# Patient Record
Sex: Female | Born: 1995 | Race: White | Hispanic: No | Marital: Single | Smoking: Never smoker
Health system: Southern US, Community
[De-identification: ages and names within clinical notes are randomized; demographics above are authoritative.]

## PROBLEM LIST (undated history)

## (undated) DIAGNOSIS — K5090 Crohn's disease, unspecified, without complications: Secondary | ICD-10-CM

## (undated) DIAGNOSIS — J329 Chronic sinusitis, unspecified: Secondary | ICD-10-CM

## (undated) DIAGNOSIS — J45909 Unspecified asthma, uncomplicated: Secondary | ICD-10-CM

---

## 2013-03-29 ENCOUNTER — Emergency Department: Payer: Self-pay | Admitting: Emergency Medicine

## 2013-09-08 ENCOUNTER — Emergency Department: Payer: Self-pay | Admitting: Emergency Medicine

## 2013-09-08 LAB — COMPREHENSIVE METABOLIC PANEL
ALK PHOS: 91 U/L
ALT: 32 U/L (ref 12–78)
ANION GAP: 4 — AB (ref 7–16)
AST: 34 U/L — AB (ref 0–26)
Albumin: 3.7 g/dL — ABNORMAL LOW (ref 3.8–5.6)
BUN: 10 mg/dL (ref 7–18)
Bilirubin,Total: 0.5 mg/dL (ref 0.2–1.0)
CREATININE: 0.54 mg/dL — AB (ref 0.60–1.30)
Calcium, Total: 9.1 mg/dL (ref 9.0–10.7)
Chloride: 102 mmol/L (ref 98–107)
Co2: 29 mmol/L (ref 21–32)
EGFR (Non-African Amer.): >60
Glucose: 87 mg/dL (ref 65–99)
OSMOLALITY: 269 (ref 275–301)
Potassium: 3.8 mmol/L (ref 3.5–5.1)
Sodium: 135 mmol/L — ABNORMAL LOW (ref 136–145)
TOTAL PROTEIN: 8.3 g/dL (ref 6.4–8.6)

## 2013-09-08 LAB — CBC WITH DIFFERENTIAL/PLATELET
BASOS ABS: 0 10*3/uL (ref 0.0–0.1)
Basophil %: 0.6 %
Eosinophil #: 0.1 10*3/uL (ref 0.0–0.7)
Eosinophil %: 2.6 %
HCT: 34.9 % — AB (ref 35.0–47.0)
HGB: 11.1 g/dL — ABNORMAL LOW (ref 12.0–16.0)
Lymphocyte #: 0.7 10*3/uL — ABNORMAL LOW (ref 1.0–3.6)
Lymphocyte %: 12.2 %
MCH: 26.8 pg (ref 26.0–34.0)
MCHC: 31.8 g/dL — ABNORMAL LOW (ref 32.0–36.0)
MCV: 84 fL (ref 80–100)
MONO ABS: 0.5 x10 3/mm (ref 0.2–0.9)
Monocyte %: 9.7 %
NEUTROS PCT: 74.9 %
Neutrophil #: 4.1 10*3/uL (ref 1.4–6.5)
Platelet: 333 10*3/uL (ref 150–440)
RBC: 4.14 10*6/uL (ref 3.80–5.20)
RDW: 17.4 % — ABNORMAL HIGH (ref 11.5–14.5)
WBC: 5.5 10*3/uL (ref 3.6–11.0)

## 2013-09-08 LAB — LIPASE, BLOOD: Lipase: 108 U/L (ref 73–393)

## 2013-09-09 LAB — MONONUCLEOSIS SCREEN: Mono Test: POSITIVE

## 2016-03-29 ENCOUNTER — Encounter: Payer: Self-pay | Admitting: Emergency Medicine

## 2016-03-29 ENCOUNTER — Emergency Department
Admission: EM | Admit: 2016-03-29 | Discharge: 2016-03-29 | Disposition: A | Discharge status: Home / Self Care | Payer: BLUE CROSS/BLUE SHIELD | Attending: Emergency Medicine | Admitting: Emergency Medicine

## 2016-03-29 ENCOUNTER — Emergency Department: Payer: BLUE CROSS/BLUE SHIELD

## 2016-03-29 DIAGNOSIS — J45909 Unspecified asthma, uncomplicated: Secondary | ICD-10-CM | POA: Diagnosis not present

## 2016-03-29 DIAGNOSIS — B2790 Infectious mononucleosis, unspecified without complication: Secondary | ICD-10-CM | POA: Diagnosis not present

## 2016-03-29 DIAGNOSIS — J029 Acute pharyngitis, unspecified: Secondary | ICD-10-CM | POA: Diagnosis present

## 2016-03-29 HISTORY — DX: Crohn's disease, unspecified, without complications: K50.90

## 2016-03-29 HISTORY — DX: Unspecified asthma, uncomplicated: J45.909

## 2016-03-29 HISTORY — DX: Chronic sinusitis, unspecified: J32.9

## 2016-03-29 LAB — BASIC METABOLIC PANEL
Anion gap: 6 (ref 5–15)
BUN: <5 mg/dL — ABNORMAL LOW (ref 6–20)
CALCIUM: 9.6 mg/dL (ref 8.9–10.3)
CO2: 28 mmol/L (ref 22–32)
CREATININE: 0.48 mg/dL (ref 0.44–1.00)
Chloride: 104 mmol/L (ref 101–111)
GFR calc Af Amer: >60 mL/min (ref >60)
GLUCOSE: 91 mg/dL (ref 65–99)
Potassium: 4.3 mmol/L (ref 3.5–5.1)
Sodium: 138 mmol/L (ref 135–145)

## 2016-03-29 LAB — POCT PREGNANCY, URINE: PREG TEST UR: NEGATIVE

## 2016-03-29 LAB — MONONUCLEOSIS SCREEN: MONO SCREEN: POSITIVE — AB

## 2016-03-29 MED ORDER — DEXAMETHASONE SODIUM PHOSPHATE 10 MG/ML IJ SOLN
10.0000 mg | Freq: Once | INTRAMUSCULAR | Status: AC
  Administered 2016-03-29: 10 mg via INTRAVENOUS

## 2016-03-29 MED ORDER — PREDNISONE 10 MG PO TABS: ORAL_TABLET | ORAL | 0 refills | Status: AC

## 2016-03-29 MED ORDER — IOPAMIDOL (ISOVUE-300) INJECTION 61%
75.0000 mL | Freq: Once | INTRAVENOUS | Status: AC | PRN
  Administered 2016-03-29: 75 mL via INTRAVENOUS
  Filled 2016-03-29: qty 75

## 2016-03-29 NOTE — ED Provider Notes (Signed)
-----------------------------------------   5:54 PM on 03/29/2016 -----------------------------------------   Blood pressure 132/76, pulse 96, temperature 98.5 F (36.9 C), temperature source Oral, resp. rate 20, height 5\' 6"  (1.676 m), weight 71.2 kg, last menstrual period 03/07/2016, SpO2 99 %.  [facility]  In short, Whitney RheinLea Esther is a 20 y.o. female with a chief complaint of Sore Throat .  Refer to the original H&P for additional details.  The current plan of care is to give Decadron 10 mg IV while in the emergency room. Patient was made aware that her mono test was positive. We have already discussed discontinuing antibiotic therapy.  I assisted discussed the treatment plan with the patient's mother. Mother does not feel comfortable with her not finishing the antibiotics that were prescribed for her for strep throat. She states that the patient does have a strong history of strep throat as well as mono. Mother was reassured that patient is not dehydrated and that she has been encouraged to continue drinking fluids and also soft foods. Patient will [facility] if any continued problems. She is aware that she is not to play sports or physical activity. She was given a prescription for prednisone to begin on Friday 30 mg for 3 days. She will return to the emergency room for any severe worsening of her symptoms.   Tommi RumpsRhonda L Valleri Hendricksen, PA-C 03/29/16 1907    Nita Sicklearolina Veronese, MD 03/29/16 [phone]

## 2016-03-29 NOTE — ED Triage Notes (Signed)
Pt reports dx with strep 2 days ago and given z pack. Has frequent sinus infections and reports this doesn't normally work on her so they called in something different but feeling worse so did not go get. Odynophagia. Tonsils swollen with exudate. Mild swelling to uvula. No respiratory distress. Swallowing secretions in triage

## 2016-03-29 NOTE — ED Notes (Signed)
Pt dx with strep throat two days ago and was on zpack , states she is not any better and hurts really bad to swallow. No resp distress noted.

## 2016-03-29 NOTE — ED Notes (Signed)
Lab notified of add-on mono test.

## 2016-03-29 NOTE — Discharge Instructions (Addendum)
Begin prednisone tomorrow as directed for the next 3 days. Discontinue taking antibiotics. You may take Tylenol or ibuprofen as needed for throat discomfort. Continue drinking lots of fluids and soft foods such as ice cream or yogurt. Sports or physical exercise for 3-4 weeks. [facility] if any continued problems.

## 2016-04-03 ENCOUNTER — Other Ambulatory Visit: Payer: Self-pay | Admitting: Emergency Medicine

## 2016-04-03 NOTE — ED Provider Notes (Signed)
[facility] Emergency Department Provider Note   ____________________________________________   First MD Initiated Contact with Patient 03/29/16 1500     (approximate)  I have reviewed the triage vital signs and the nursing notes.   HISTORY  Chief Complaint Sore Throat    HPI Whitney Nunez is a 20 y.o. female who presents emergency room with complaints of increasingly sore throat despite antibiotic therapy. She reports she was seen 2 days prior and given a Z-Pak for a sinus infection as rash strep throat. Patient states that her tonsillar T Earlene Plater will with exudate noted. Denies any respiratory distress or swallowing difficulties.   Past Medical History:  Diagnosis Date  . Asthma   . Crohn disease (HCC)   . Sinus infection     There are no active problems to display for this patient.   History reviewed. No pertinent surgical history.  Prior to Admission medications   Medication Sig Start Date End Date Taking? Authorizing Provider  predniSONE (DELTASONE) 10 MG tablet Take 3 tablets once a day for 3 days beginning Friday 03/29/16   Tommi Rumps, PA-C    Allergies Sulfa antibiotics and Amoxicillin  History reviewed. No pertinent family history.  Social History Social History  Substance Use Topics  . Smoking status: Never Smoker  . Smokeless tobacco: Not on file  . Alcohol use Yes    Review of Systems Constitutional: No fever/chills ENT: Positive sore throat Cardiovascular: Denies chest pain. Respiratory: Denies shortness of breath. Musculoskeletal: Negative for back pain. Skin: Negative for rash. Neurological: Negative for headaches, focal weakness or numbness.  10-point ROS otherwise negative.  ____________________________________________   PHYSICAL EXAM:  VITAL SIGNS: ED Triage Vitals  Enc Vitals Group     BP 03/29/16 1326 123/78     Pulse Rate 03/29/16 1326 100     Resp 03/29/16 1326 18     Temp 03/29/16 1326 98.5  F (36.9 C)     Temp Source 03/29/16 1326 Oral     SpO2 03/29/16 1326 100 %     Weight 03/29/16 1327 157 lb (71.2 kg)     Height 03/29/16 1327 5\' 6"  (1.676 m)     Head Circumference --      Peak Flow --      Pain Score 03/29/16 1328 8     Pain Loc --      Pain Edu? --      Excl. in GC? --     Constitutional: Alert and oriented. Well appearing and in no acute distress. Eyes: Conjunctivae are normal. PERRL. EOMI. Head: Atraumatic. Nose: No congestion/rhinnorhea. Mouth/Throat: Mucous membranes are moist.  Oropharynx Is extremely erythematous with tonsillar exudate and swelling noted 2+ edema. Neck: No stridor.   Cardiovascular: Normal rate, regular rhythm. Grossly normal heart sounds.  Good peripheral circulation. Respiratory: Normal respiratory effort.  No retractions. Lungs CTAB. Gastrointestinal: Soft and nontender. No distention. No abdominal bruits. No CVA tenderness. Musculoskeletal: No lower extremity tenderness nor edema.  No joint effusions. Neurologic:  Normal speech and language. No gross focal neurologic deficits are appreciated. No gait instability. Skin:  Skin is warm, dry and intact. No rash noted. Psychiatric: Mood and affect are normal. Speech and behavior are normal.  ____________________________________________   LABS (all labs ordered are listed, but only abnormal results are displayed)  Labs Reviewed  BASIC METABOLIC PANEL - Abnormal; Notable for the following:       Result Value   BUN <5 (*)    All other  components within normal limits  MONONUCLEOSIS SCREEN - Abnormal; Notable for the following:    Mono Screen POSITIVE (*)    All other components within normal limits  POC URINE PREG, ED  POCT PREGNANCY, URINE   ____________________________________________  EKG   ____________________________________________  RADIOLOGY  IMPRESSION: 1. Mild generalized adenoid and tonsillar hypertrophy and enhancement with reactive appearing bilateral level 2 and  level 3 lymph nodes. Appearance is compatible with acute URI. No tonsillar abscess or other complicating features. 2. Maxillary sinus mucous retention cysts, significance doubtful. ____________________________________________   PROCEDURES  Procedure(s) performed: None  Procedures  Critical Care performed: No  ____________________________________________   INITIAL IMPRESSION / ASSESSMENT AND PLAN / ED COURSE  Pertinent labs & imaging results that were available during my care of the patient were reviewed by me and considered in my medical decision making (see chart for details).  Probable mononucleosis with acute URI. Patient transferred to Bridget Hartshornhonda Summers, PA at change of shift for definitive care.  Clinical Course     ____________________________________________   FINAL CLINICAL IMPRESSION(S) / ED DIAGNOSES  Final diagnoses:  Mononucleosis      NEW MEDICATIONS STARTED DURING THIS VISIT:  Discharge Medication List as of 03/29/2016  6:52 PM    START taking these medications   Details  predniSONE (DELTASONE) 10 MG tablet Take 3 tablets once a day for 3 days beginning Friday, Print         Note:  This document was prepared using Dragon voice recognition software and may include unintentional dictation errors.   Evangeline DakinCharles M Merilee Wible, PA-C 04/03/16 1313    Nita Sicklearolina Veronese, MD 04/03/16 1447

## 2016-07-14 ENCOUNTER — Emergency Department
Admission: EM | Admit: 2016-07-14 | Discharge: 2016-07-14 | Disposition: A | Discharge status: Home / Self Care | Payer: BLUE CROSS/BLUE SHIELD | Attending: Emergency Medicine | Admitting: Emergency Medicine

## 2016-07-14 DIAGNOSIS — J45909 Unspecified asthma, uncomplicated: Secondary | ICD-10-CM | POA: Insufficient documentation

## 2016-07-14 DIAGNOSIS — J111 Influenza due to unidentified influenza virus with other respiratory manifestations: Secondary | ICD-10-CM | POA: Insufficient documentation

## 2016-07-14 DIAGNOSIS — R509 Fever, unspecified: Secondary | ICD-10-CM | POA: Diagnosis present

## 2016-07-14 MED ORDER — OSELTAMIVIR PHOSPHATE 75 MG PO CAPS: 75.0000 mg | ORAL_CAPSULE | Freq: Two times a day (BID) | ORAL | 0 refills | Status: AC

## 2016-07-14 NOTE — Discharge Instructions (Signed)
May take OTC cold/flu medications as tolerated for supportive care.  Tylenol and Motrin as needed for fever and aches.  Drink fluids such as water and Gatorade to stay hydrated

## 2016-07-14 NOTE — ED Provider Notes (Signed)
[facility] Emergency Department Provider Note  ____________________________________________  Time seen: Approximately 4:01 PM  I have reviewed the triage vital signs and the nursing notes.   HISTORY  Chief Complaint URI    HPI Whitney Nunez is a 21 y.o. female , NAD, presents emergency department for evaluation of 2 day history of fevers, chills and body aches. Patient states she has been exposed to multiple family friends with influenza. States yesterday she began to have chills, fatigue and body aches. Also reports sore throat, cough and chest congestion. Has had no ear pain, sinus pressure, nasal congestion or drainage. Denies abdominal pain, nausea, vomiting or diarrhea.   Past Medical History:  Diagnosis Date  . Asthma   . Crohn disease (HCC)   . Sinus infection     There are no active problems to display for this patient.   History reviewed. No pertinent surgical history.  Prior to Admission medications   Medication Sig Start Date End Date Taking? Authorizing Provider  oseltamivir (TAMIFLU) 75 MG capsule Take 1 capsule (75 mg total) by mouth 2 (two) times daily. 07/14/16   Yusra Ravert L Kam Rahimi, PA-C  predniSONE (DELTASONE) 10 MG tablet Take 3 tablets once a day for 3 days beginning Friday 03/29/16   Tommi Rumps, PA-C    Allergies Sulfa antibiotics and Amoxicillin  No family history on file.  Social History Social History  Substance Use Topics  . Smoking status: Never Smoker  . Smokeless tobacco: Never Used  . Alcohol use Yes     Review of Systems  Constitutional: Positive fever, chills and fatigue. ENT: Positive sore throat. No sinus pressure, ear pressure, nasal congestion. Cardiovascular: No chest pain. Respiratory: Positive cough, chest congestion. No shortness of breath. No wheezing.  Gastrointestinal: No abdominal pain.  No nausea, vomiting.  No diarrhea.  No constipation. Genitourinary: Negative for dysuria. No hematuria. No  urinary hesitancy, urgency or increased frequency. Musculoskeletal: Positive for general myalgias.  Skin: Negative for rash. Neurological: Negative for headaches. 10-point ROS otherwise negative.  ____________________________________________   PHYSICAL EXAM:  VITAL SIGNS: ED Triage Vitals [07/14/16 1526]  Enc Vitals Group     BP (!) 112/58     Pulse Rate 62     Resp 18     Temp 99.1 F (37.3 C)     Temp Source Oral     SpO2 (!) 10 %     Weight 163 lb (73.9 kg)     Height 5\' 6"  (1.676 m)     Head Circumference      Peak Flow      Pain Score 5     Pain Loc      Pain Edu?      Excl. in GC?    Patient's oxygen saturation noted in triage as 10% is a mistake and should be 100%.  Constitutional: Alert and oriented. Well appearing and in no acute distress. Eyes: Conjunctivae are normal.  Head: Atraumatic. ENT:      Ears: TMs visualized bilaterally with mild serous effusion but no bulging, erythema or perforation.      Nose: Mild congestion with clear rhinorrhea.      Mouth/Throat: Mucous membranes are moist. Pharynx without erythema, swelling, a jeep. Uvula is midline. Clear postnasal drip. Neck: Supple with full range of motion. Hematological/Lymphatic/Immunilogical: No cervical lymphadenopathy. Cardiovascular: Normal rate, regular rhythm. Normal S1 and S2.  Good peripheral circulation. Respiratory: Normal respiratory effort without tachypnea or retractions. Lungs CTAB with breath sounds noted in all  lung fields. No wheeze, rhonchi, rales Neurologic:  Normal speech and language. No gross focal neurologic deficits are appreciated.  Skin:  Skin is warm, dry and intact. No rash noted. Psychiatric: Mood and affect are normal. Speech and behavior are normal. Patient exhibits appropriate insight and judgement.   ____________________________________________    LABS  None ____________________________________________  EKG  None ____________________________________________  RADIOLOGY  None ____________________________________________    PROCEDURES  Procedure(s) performed: None   Procedures   Medications - No data to display   ____________________________________________   INITIAL IMPRESSION / ASSESSMENT AND PLAN / ED COURSE  Pertinent labs & imaging results that were available during my care of the patient were reviewed by me and considered in my medical decision making (see chart for details).  Clinical Course     Patient's diagnosis is consistent with Influenza. Patient will be discharged home with prescriptions for Tamiflu to take extracted. Patient is advised to take over-the-counter cold and flu medications as needed for symptomatic care. May take Tylenol and ibuprofen dosing for fever or aches. [facility] if symptoms persist past this treatment course. Patient is given ED precautions to return to the ED for any worsening or new symptoms.    ____________________________________________  FINAL CLINICAL IMPRESSION(S) / ED DIAGNOSES  Final diagnoses:  Influenza      NEW MEDICATIONS STARTED DURING THIS VISIT:  Discharge Medication List as of 07/14/2016  4:12 PM    START taking these medications   Details  oseltamivir (TAMIFLU) 75 MG capsule Take 1 capsule (75 mg total) by mouth 2 (two) times daily., Starting Sat 07/14/2016, Print             Ernestene KielJami L CamancheHagler, PA-C 07/14/16 1621    Myrna Blazeravid Matthew Schaevitz, MD 07/14/16 [phone]

## 2016-07-14 NOTE — ED Triage Notes (Signed)
Pt arrives to ER via POV c/o URI sx. States that everyone she has been around has been dx with flu. Pt wearing mask at time of triage. Denies vomiting or diarrhea. Pt alert and oriented X4, active, cooperative, pt in NAD. RR even and unlabored, color WNL.

## 2016-07-19 ENCOUNTER — Encounter: Payer: Self-pay | Admitting: Emergency Medicine

## 2016-07-19 ENCOUNTER — Emergency Department: Payer: BLUE CROSS/BLUE SHIELD

## 2016-07-19 ENCOUNTER — Emergency Department
Admission: EM | Admit: 2016-07-19 | Discharge: 2016-07-19 | Disposition: A | Discharge status: Home / Self Care | Payer: BLUE CROSS/BLUE SHIELD | Attending: Emergency Medicine | Admitting: Emergency Medicine

## 2016-07-19 DIAGNOSIS — J45909 Unspecified asthma, uncomplicated: Secondary | ICD-10-CM | POA: Diagnosis not present

## 2016-07-19 DIAGNOSIS — R0781 Pleurodynia: Secondary | ICD-10-CM | POA: Diagnosis present

## 2016-07-19 DIAGNOSIS — R0789 Other chest pain: Secondary | ICD-10-CM | POA: Diagnosis not present

## 2016-07-19 LAB — CBC
HEMATOCRIT: 35.7 % (ref 35.0–47.0)
HEMOGLOBIN: 11.7 g/dL — AB (ref 12.0–16.0)
MCH: 28.8 pg (ref 26.0–34.0)
MCHC: 32.9 g/dL (ref 32.0–36.0)
MCV: 87.7 fL (ref 80.0–100.0)
Platelets: 324 10*3/uL (ref 150–440)
RBC: 4.06 MIL/uL (ref 3.80–5.20)
RDW: 19.2 % — ABNORMAL HIGH (ref 11.5–14.5)
WBC: 4.6 10*3/uL (ref 3.6–11.0)

## 2016-07-19 LAB — BASIC METABOLIC PANEL
ANION GAP: 6 (ref 5–15)
BUN: 10 mg/dL (ref 6–20)
CO2: 28 mmol/L (ref 22–32)
Calcium: 9.8 mg/dL (ref 8.9–10.3)
Chloride: 106 mmol/L (ref 101–111)
Creatinine, Ser: 0.44 mg/dL (ref 0.44–1.00)
GFR calc non Af Amer: >60 mL/min (ref >60)
Glucose, Bld: 74 mg/dL (ref 65–99)
POTASSIUM: 4.2 mmol/L (ref 3.5–5.1)
SODIUM: 140 mmol/L (ref 135–145)

## 2016-07-19 LAB — TROPONIN I

## 2016-07-19 MED ORDER — NAPROXEN 500 MG PO TABS: 500.0000 mg | ORAL_TABLET | Freq: Two times a day (BID) | ORAL | 2 refills | Status: DC

## 2016-07-19 NOTE — ED Provider Notes (Signed)
[facility] Emergency Department Provider Note   ____________________________________________    I have reviewed the triage vital signs and the nursing notes.   HISTORY  Chief Complaint Chest Pain (Lower L Rib Pain)     HPI Hagan Maltz is a 21 y.o. female who presents with complaints of left lower rib pain for 2 days. Patient denies injury to the area. She denies shortness of breath. No recent travel. No calf pain or swelling. She has never had this before. No rash or redness. She reports she has pain when she twists her torso or takes a deep breath or pushes on the area   Past Medical History:  Diagnosis Date  . Asthma   . Crohn disease (HCC)   . Sinus infection     There are no active problems to display for this patient.   History reviewed. No pertinent surgical history.  Prior to Admission medications   Medication Sig Start Date End Date Taking? Authorizing Provider  oseltamivir (TAMIFLU) 75 MG capsule Take 1 capsule (75 mg total) by mouth 2 (two) times daily. 07/14/16   Jami L Hagler, PA-C  predniSONE (DELTASONE) 10 MG tablet Take 3 tablets once a day for 3 days beginning Friday 03/29/16   Tommi Rumps, PA-C     Allergies Sulfa antibiotics and Amoxicillin  No family history on file.  Social History Social History  Substance Use Topics  . Smoking status: Never Smoker  . Smokeless tobacco: Never Used  . Alcohol use Yes    Review of Systems  Constitutional: No fever/chills     Gastrointestinal: No abdominal pain.  No nausea, no vomiting.    Musculoskeletal: Negative for back pain. Skin: Negative for rash. Neurological: Negative for headaches     ____________________________________________   PHYSICAL EXAM:  VITAL SIGNS: ED Triage Vitals  Enc Vitals Group     BP 07/19/16 1645 (!) 143/91     Pulse Rate 07/19/16 1645 75     Resp 07/19/16 1645 18     Temp 07/19/16 1645 97.4 F (36.3 C)     Temp Source  07/19/16 1645 Oral     SpO2 07/19/16 1645 100 %     Weight 07/19/16 1646 160 lb (72.6 kg)     Height 07/19/16 1646 5\' 6"  (1.676 m)     Head Circumference --      Peak Flow --      Pain Score 07/19/16 1646 8     Pain Loc --      Pain Edu? --      Excl. in GC? --     Constitutional: Alert and oriented. No acute distress. Pleasant and interactive  Nose: No congestion/rhinnorhea. Mouth/Throat: Mucous membranes are moist.   Cardiovascular: Normal rate, regular rhythm. Tenderness to palpation along the left 12th medial rib Respiratory: Normal respiratory effort.  No retractions. Genitourinary: deferred Musculoskeletal: No lower extremity tenderness nor edema.   Neurologic:  Normal speech and language. No gross focal neurologic deficits are appreciated.   Skin:  Skin is warm, dry and intact. No rash noted.   ____________________________________________   LABS (all labs ordered are listed, but only abnormal results are displayed)  Labs Reviewed  CBC - Abnormal; Notable for the following:       Result Value   Hemoglobin 11.7 (*)    RDW 19.2 (*)    All other components within normal limits  BASIC METABOLIC PANEL  TROPONIN I   ____________________________________________  EKG  ED  ECG REPORT I, Jene EveryKINNER, Melayah Skorupski, the attending physician, personally viewed and interpreted this ECG.  Date: 07/19/2016  Rhythm: normal sinus rhythm QRS Axis: normal Intervals: normal ST/T Wave abnormalities: normal Conduction Disturbances: none Narrative Interpretation: unremarkable  ____________________________________________  RADIOLOGY  Normal chest x-ray ____________________________________________   PROCEDURES  Procedure(s) performed: No    Critical Care performed: No ____________________________________________   INITIAL IMPRESSION / ASSESSMENT AND PLAN / ED COURSE  Pertinent labs & imaging results that were available during my care of the patient were reviewed by me and  considered in my medical decision making (see chart for details).  Patient well-appearing and in no acute distress. EKG and labs are reassuring. Chest x-ray is benign. Patient is tender to palpation along the 12th rib on the left, strongly suspect costochondritis or other musculoskeletal pain. Recommend supportive treatment with NSAIDs and ice and PCP follow-up. Not consistent with PE given external tenderness, lack of shortness of breath, lack of tachycardia and normal EKG.   ____________________________________________   FINAL CLINICAL IMPRESSION(S) / ED DIAGNOSES  Final diagnoses:  Chest wall pain      NEW MEDICATIONS STARTED DURING THIS VISIT:  Discharge Medication List as of 07/19/2016  6:50 PM       Note:  This document was prepared using Dragon voice recognition software and may include unintentional dictation errors.    Jene Everyobert Jennesis Ramaswamy, MD 07/19/16 2138

## 2016-07-19 NOTE — ED Triage Notes (Signed)
Pt presents to ED with c/o L lower rib pain. Pt states was recently dx with flu and has been taking tamiflu. Pt denies any known injury to her L rib, pt states hurts all the way around. Pt is alert and oriented at this time. Pt denies any known injury at this time.

## 2016-07-19 NOTE — ED Notes (Signed)
Pt c/o RT rib cage area pain after painful BM yesterday. 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 Pt states painful during with breathing and cough. Pt ambulatory, NAD noted

## 2017-06-24 IMAGING — CT CT NECK W/ CM
3 of 5 series · 12 of 33 positions shown, 14 images · IV contrast (iopamidol)
Comparison: None.

CLINICAL DATA: 20-year-old female with sore throat for 2 days on
antibiotics but symptoms progressing. Initial encounter.

EXAM:
CT NECK WITH CONTRAST
TECHNIQUE: Multidetector CT imaging of the neck was performed using the
standard protocol following the bolus administration of intravenous
contrast.
CONTRAST:  75mL [id number] IOPAMIDOL ([id number]) INJECTION 61%

[Series 6: sag neck · sagittal · 0.45mm/px · 5 of 123 slices shown, 6 images]
[im 41/123  bone]
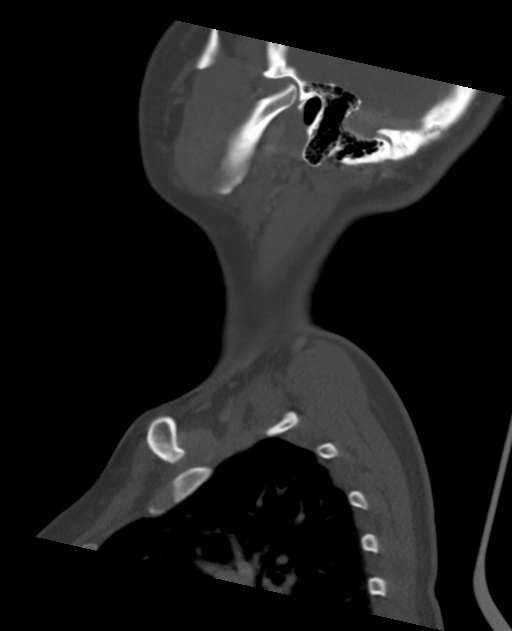
[im 51/123  bone]
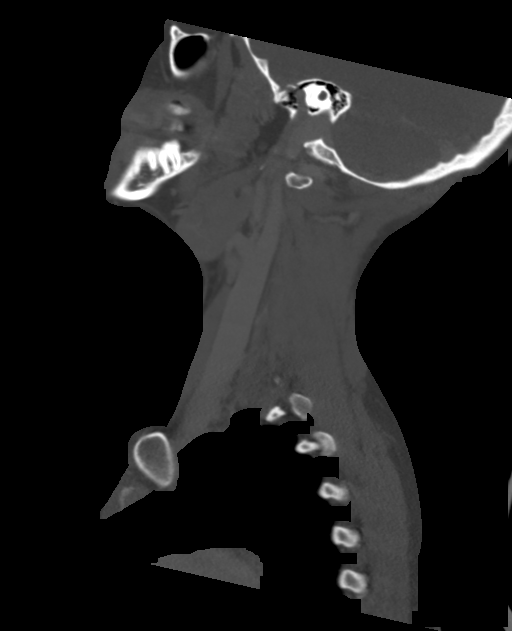
[im 62/123  soft-tissue]
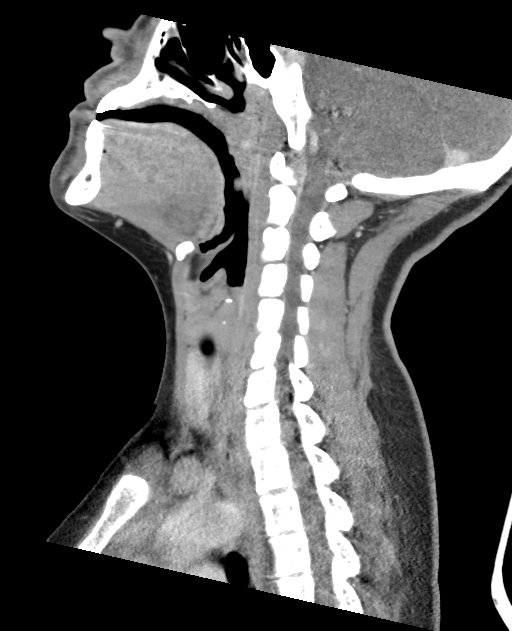
[im 62/123  bone]
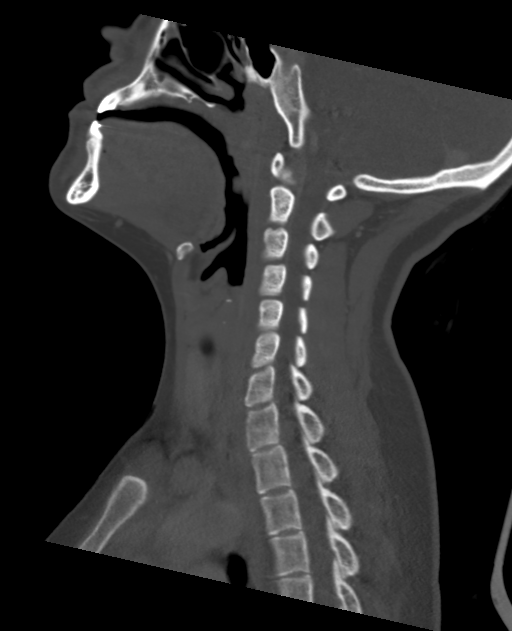
[im 72/123  bone]
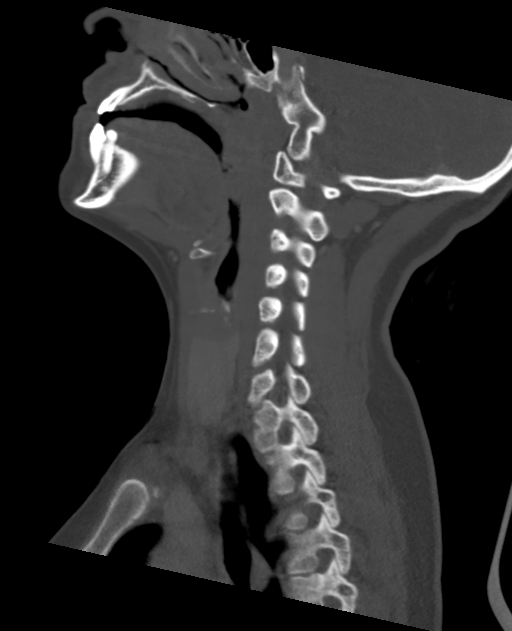
[im 82/123  bone]
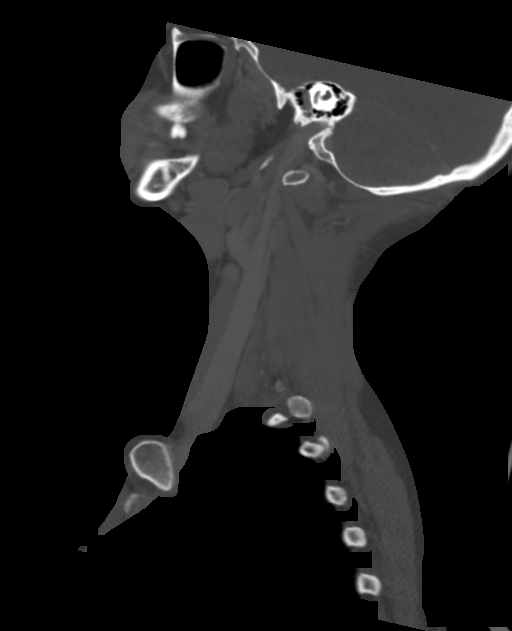

[Series 7: cor neck · coronal · 0.39mm/px · 3 of 99 slices shown]
[im 20/99  bone]
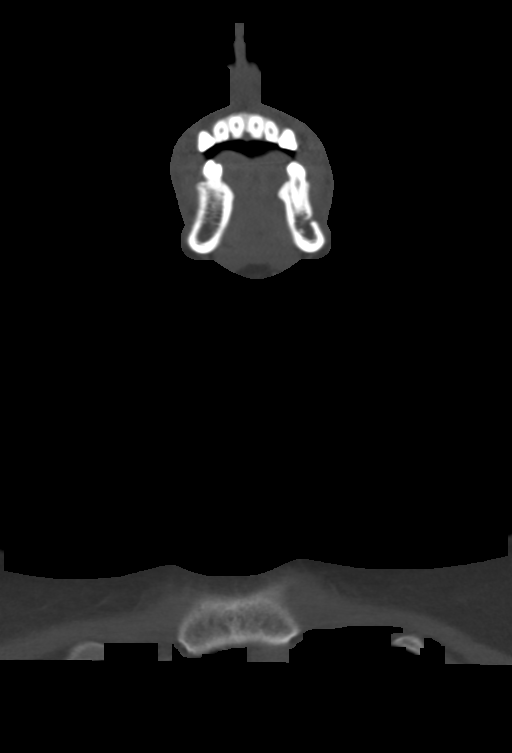
[im 40/99  bone]
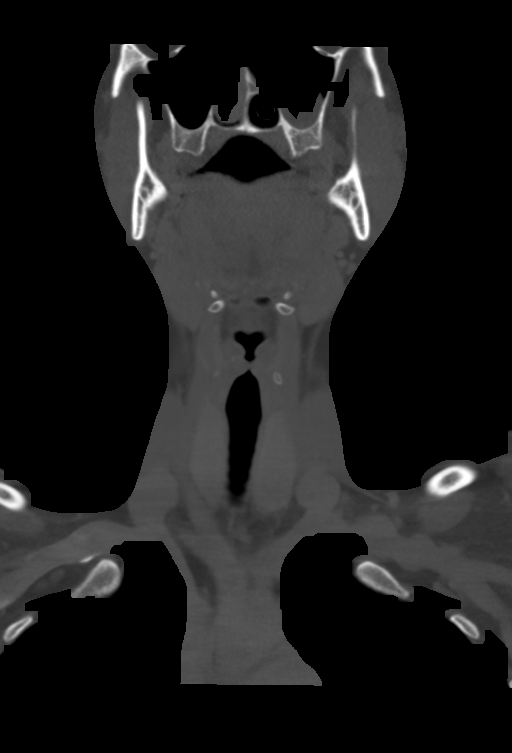
[im 59/99  bone]
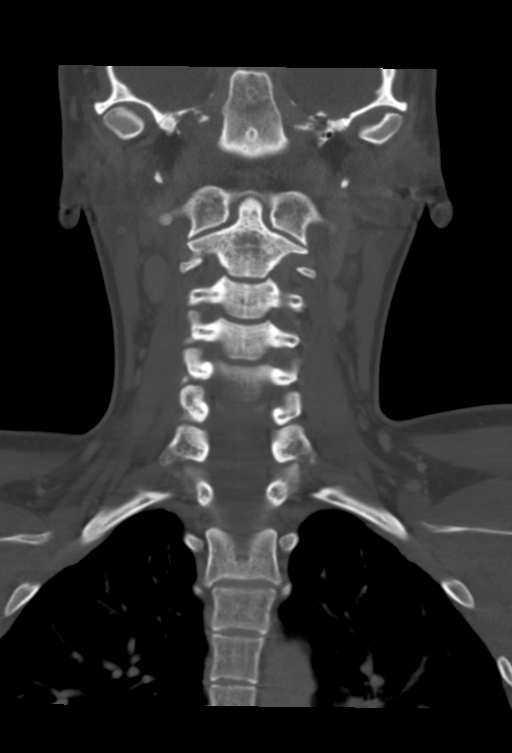

[Series 8: orthogonal ax · axial · 0.39mm/px · z∈[-187,-14]mm · 4 of 149 slices shown, 5 images]
[im 30/149  soft-tissue]
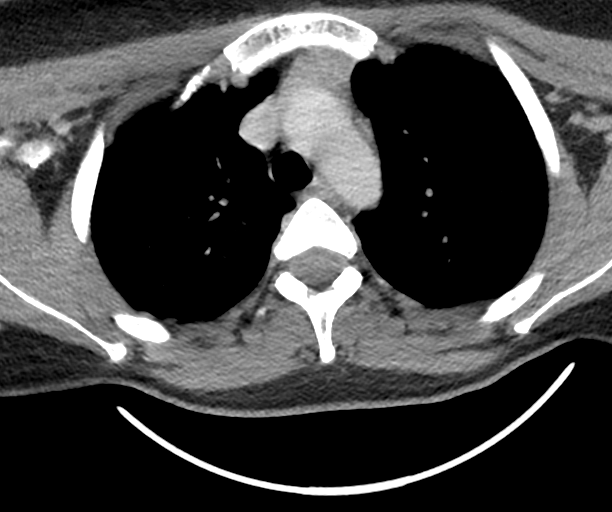
[im 30/149  bone]
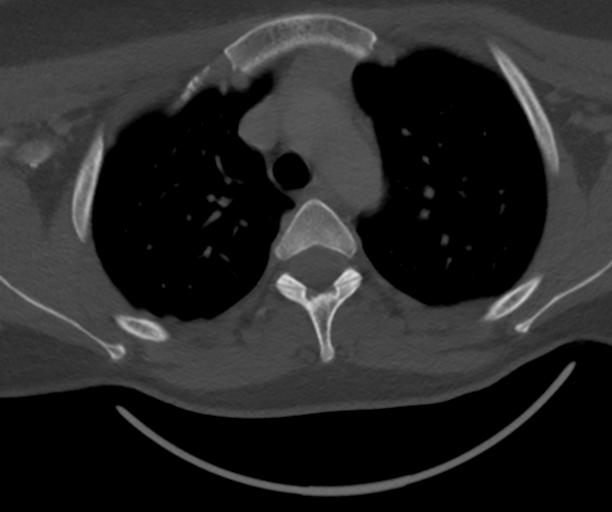
[im 60/149  bone]
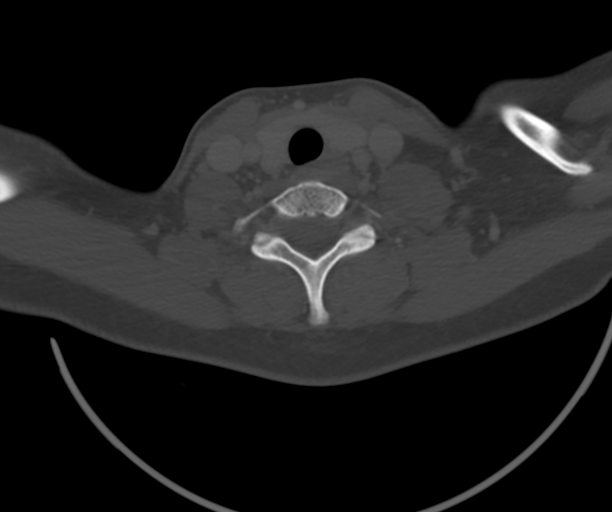
[im 89/149  bone]
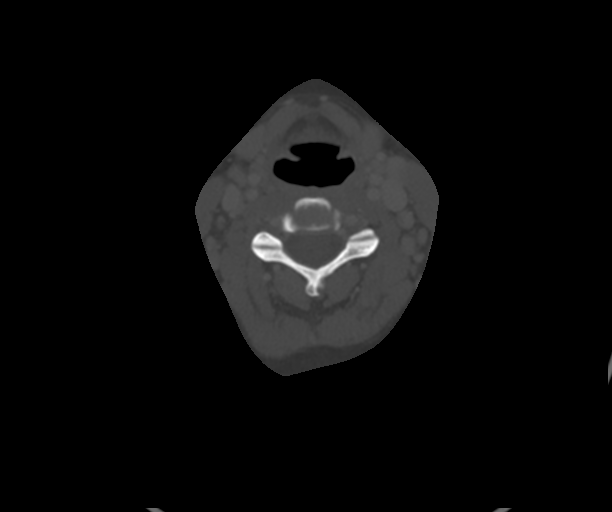
[im 119/149  bone]
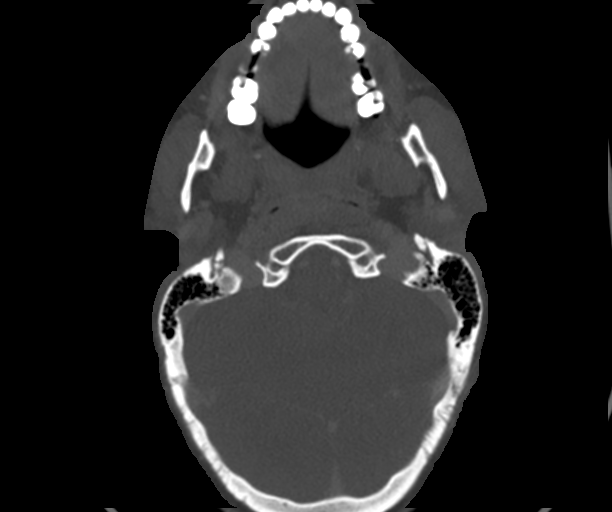

[12 of 33 positions shown; findings below may reference images not displayed]

FINDINGS: Pharynx and larynx: The larynx and epiglottis are within normal
limits. There is mild palatine tonsil, lingual tonsil, and adenoid
hypertrophy. Mild associated tonsillar hyper enhancement. No
parapharyngeal space inflammation. Negative retropharyngeal space.

Salivary glands: Negative sublingual space. Negative submandibular
and parotid glands.

Thyroid: Negative.

Lymph nodes: Bilateral cervical lymph nodes are mildly rounded and
enhancing but remain normal size for age. No cystic or necrotic
nodes. There is an increased number of small bilateral level 2 and
level 3 nodes.

Vascular: Major vascular structures in the neck and at the skullbase
are patent including both internal jugular veins.

Limited intracranial: Negative.

Visualized orbits: Not included.

Mastoids and visualized paranasal sinuses: Left maxillary mucous
retention cyst. Small right maxillary mucous retention cysts. Other
visible paranasal sinuses, the tympanic cavities, and mastoids are
clear.

Skeleton: Dentition appears normal. No osseous abnormality
identified.

Upper chest: Negative lung apices. Small volume residual thymus in
the superior mediastinum which otherwise is normal. No axillary
lymphadenopathy.

Other: None.
IMPRESSION: 1. Mild generalized adenoid and tonsillar hypertrophy and
enhancement with reactive appearing bilateral level 2 and level 3
lymph nodes.
Appearance is compatible with acute URI. No tonsillar abscess or
other complicating features.
2. Maxillary sinus mucous retention cysts, significance doubtful.

## 2017-10-14 IMAGING — CR DG CHEST 2V
2 series · 2 of 2 positions shown · non-contrast
Comparison: None.

CLINICAL DATA: Left lower anterior rib pain for the past 2 days.

EXAM:
CHEST  2 VIEW

[chest pa]
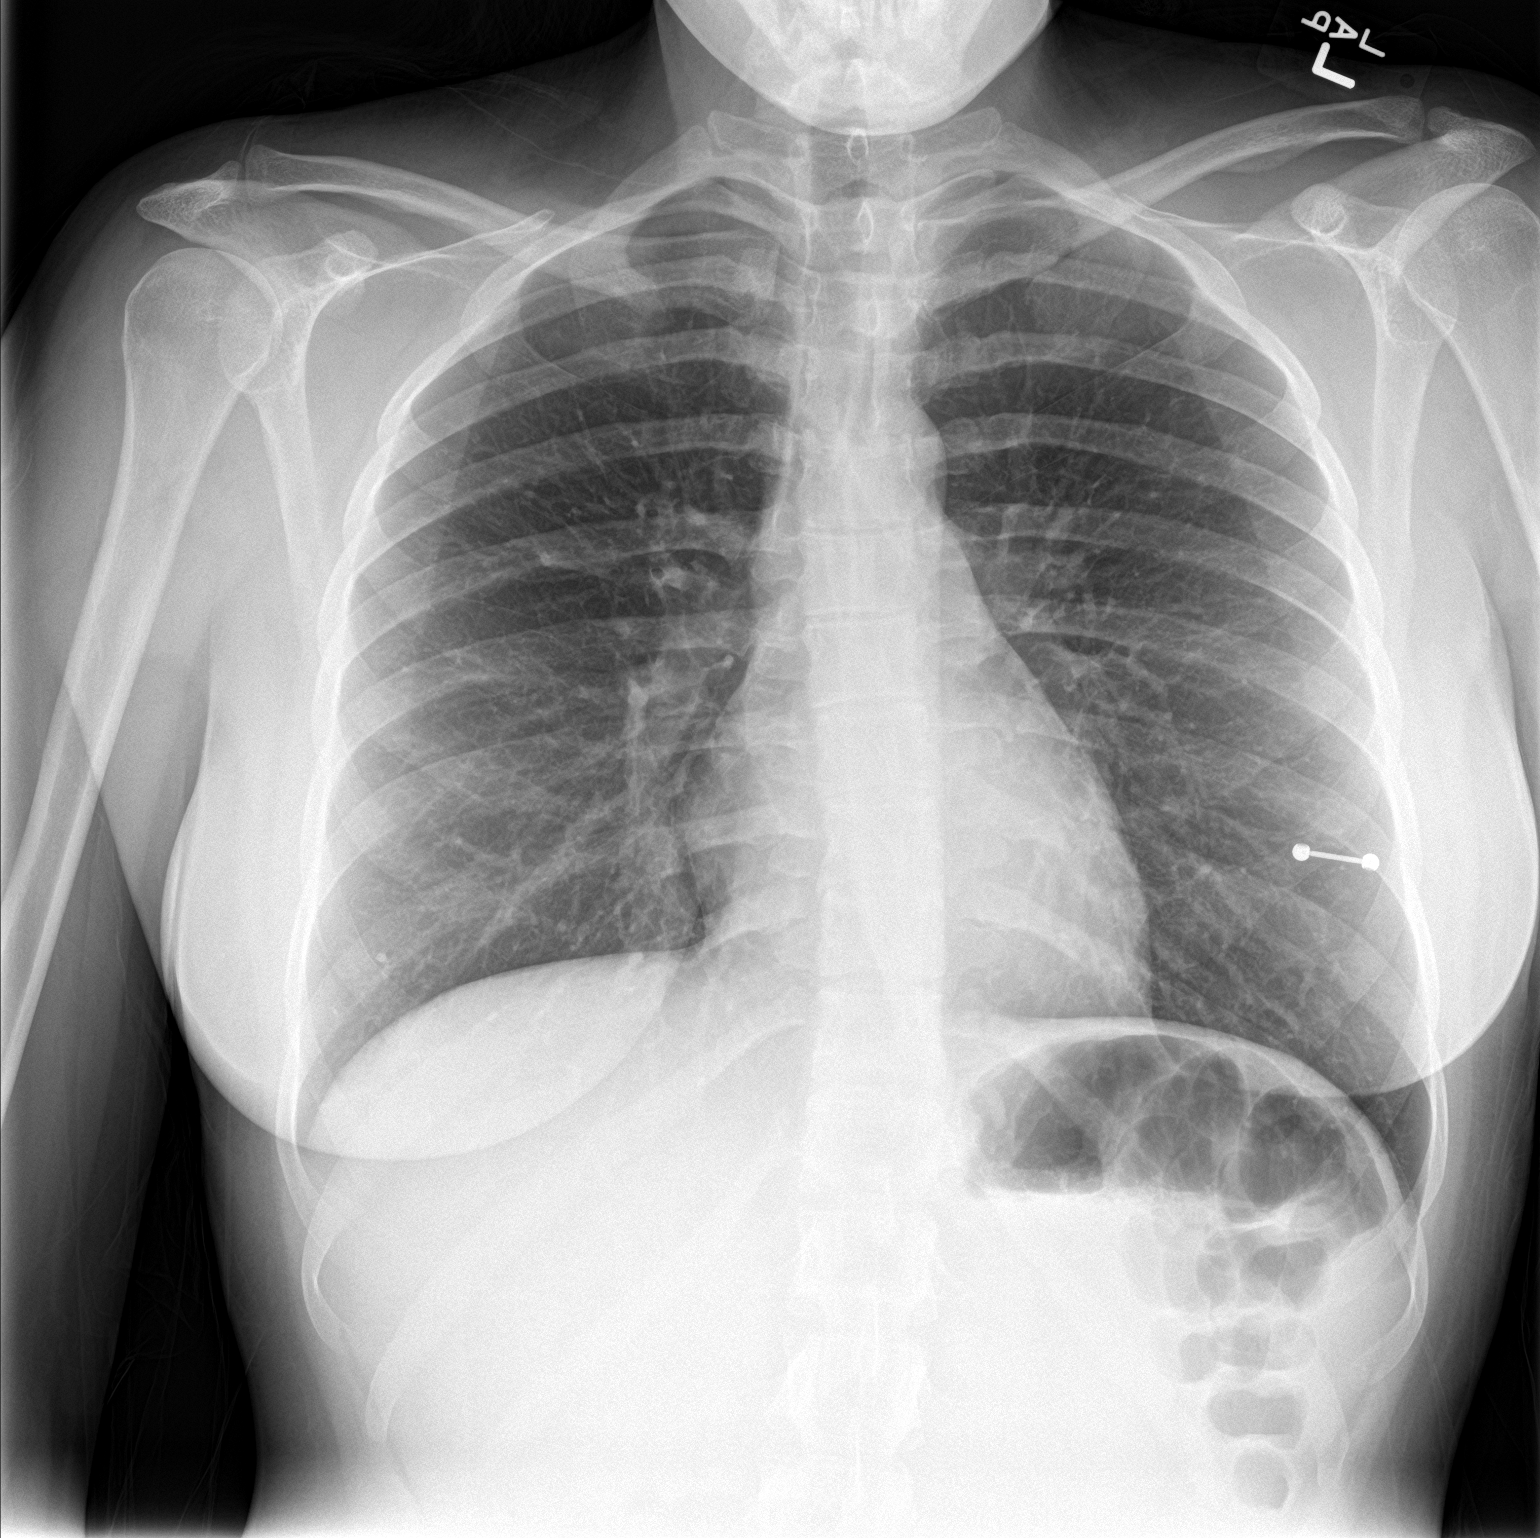

[chest lat]
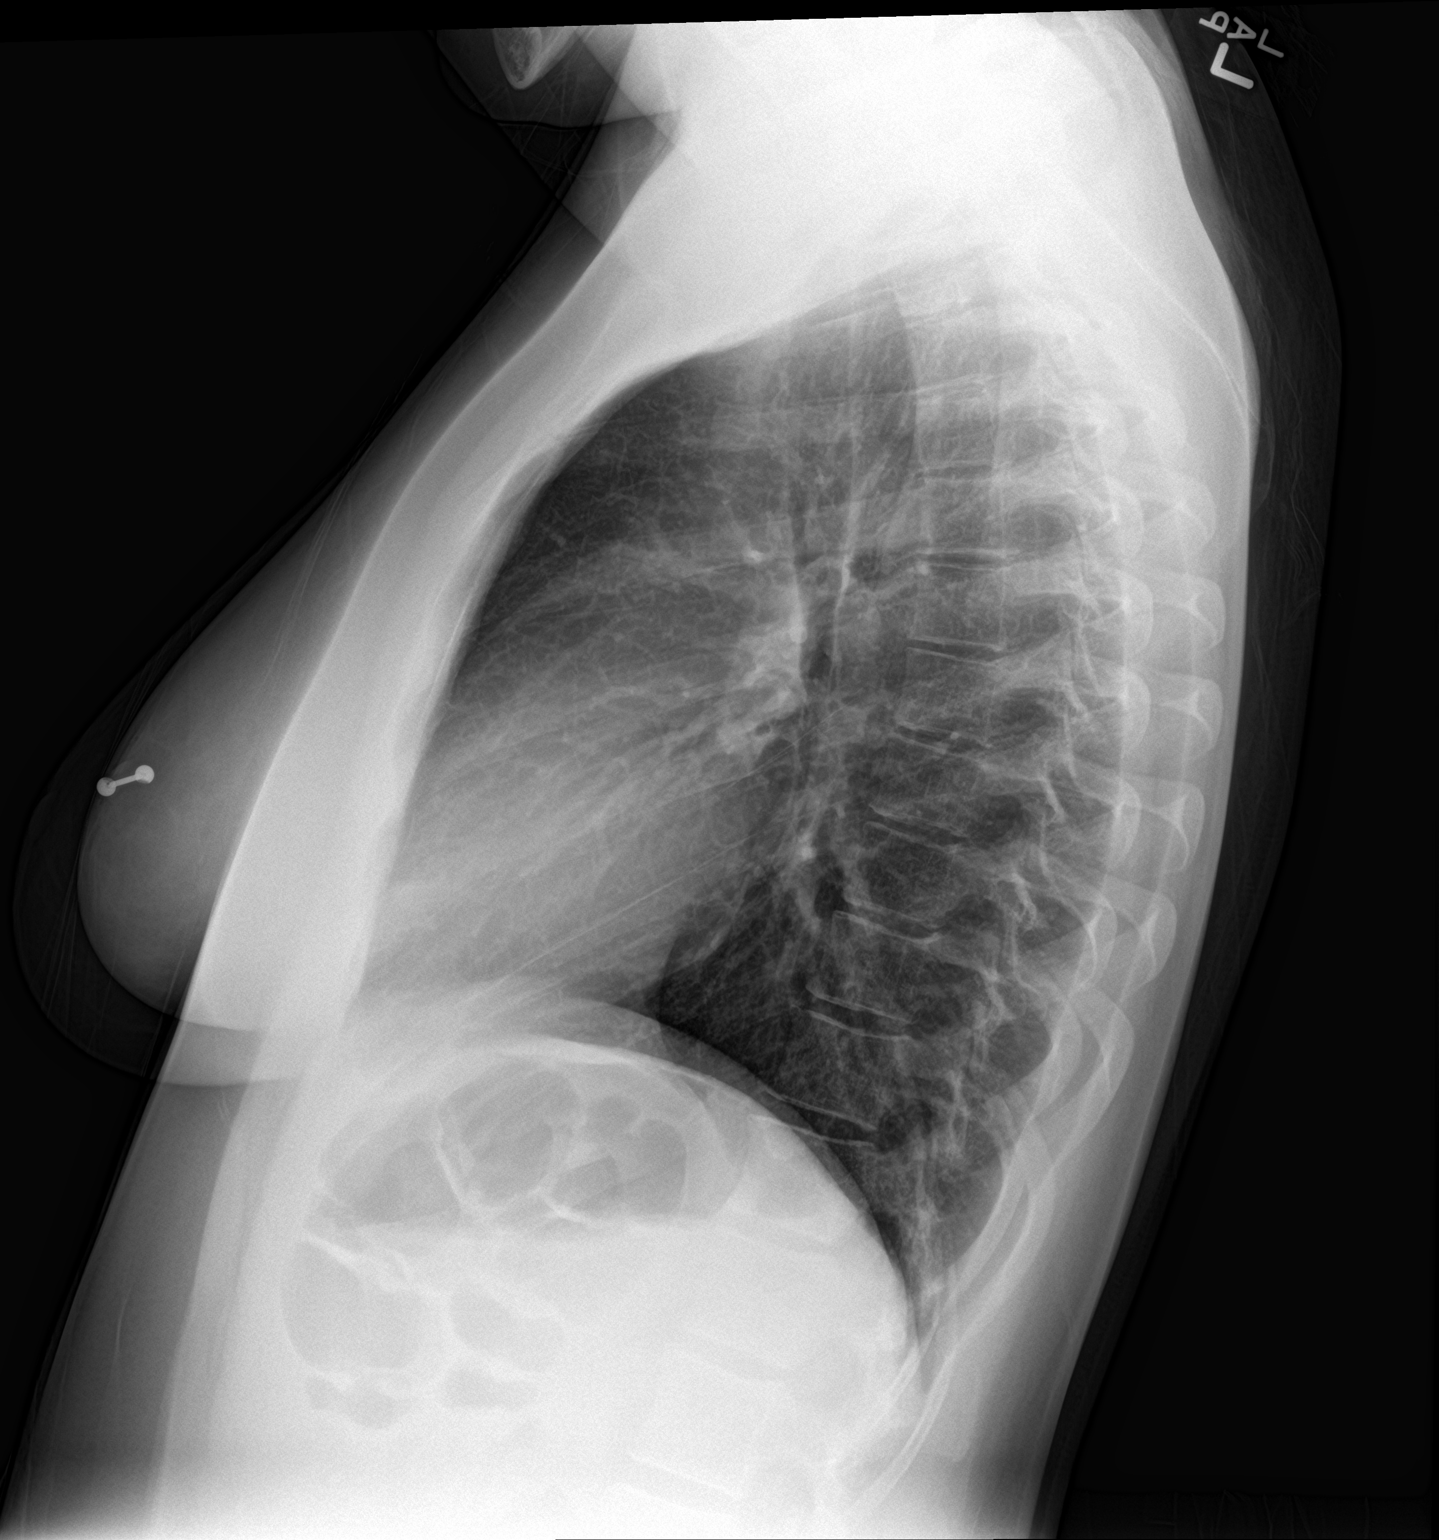

[2 of 2 positions shown; findings below may reference images not displayed]

FINDINGS: Normal sized heart. Clear lungs. Minimal diffuse peribronchial
thickening. Left nipple stud. Minimal scoliosis.
IMPRESSION: Minimal bronchitic changes.
